# Patient Record
Sex: Female | Born: 2000 | Race: White | Hispanic: No | Marital: Single | Smoking: Never smoker
Health system: Southern US, Community
[De-identification: ages and names within clinical notes are randomized; demographics above are authoritative.]

## PROBLEM LIST (undated history)

## (undated) DIAGNOSIS — F909 Attention-deficit hyperactivity disorder, unspecified type: Secondary | ICD-10-CM

## (undated) DIAGNOSIS — S83106A Unspecified dislocation of unspecified knee, initial encounter: Secondary | ICD-10-CM

## (undated) HISTORY — DX: Attention-deficit hyperactivity disorder, unspecified type: F90.9

## (undated) HISTORY — PX: MOUTH RANULA EXCISION: SHX2049

## (undated) HISTORY — PX: TYMPANOSTOMY TUBE PLACEMENT: SHX32

---
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
## 2022-01-11 DIAGNOSIS — Z3046 Encounter for surveillance of implantable subdermal contraceptive: Secondary | ICD-10-CM | POA: Diagnosis not present

## 2022-01-11 DIAGNOSIS — Z3202 Encounter for pregnancy test, result negative: Secondary | ICD-10-CM | POA: Diagnosis not present

## 2022-01-12 ENCOUNTER — Institutional Professional Consult (permissible substitution): Payer: BC Managed Care – PPO | Admitting: Plastic Surgery

## 2022-01-13 DIAGNOSIS — F4325 Adjustment disorder with mixed disturbance of emotions and conduct: Secondary | ICD-10-CM | POA: Diagnosis not present

## 2022-02-01 ENCOUNTER — Telehealth: Payer: Self-pay | Admitting: Family Medicine

## 2022-02-01 DIAGNOSIS — F988 Other specified behavioral and emotional disorders with onset usually occurring in childhood and adolescence: Secondary | ICD-10-CM

## 2022-02-01 MED ORDER — VYVANSE 20 MG PO CAPS: 20.0000 mg | ORAL_CAPSULE | Freq: Every day | ORAL | 0 refills | Status: DC

## 2022-02-01 NOTE — Telephone Encounter (Signed)
Refill request for  Vyvance 20 mg LR 12/21/21, #30, 0 r LOV 10/13/21 FOV 02/12/22  Please review and advise.  Thanks.  Dm/cma

## 2022-02-01 NOTE — Telephone Encounter (Signed)
Pt needs her Vyvanse refilled at [facility] on [address].

## 2022-02-09 ENCOUNTER — Institutional Professional Consult (permissible substitution): Payer: BC Managed Care – PPO | Admitting: Plastic Surgery

## 2022-02-12 ENCOUNTER — Ambulatory Visit: Payer: BC Managed Care – PPO | Admitting: Family Medicine

## 2022-02-12 DIAGNOSIS — Z0142 Encounter for cervical smear to confirm findings of recent normal smear following initial abnormal smear: Secondary | ICD-10-CM | POA: Diagnosis not present

## 2022-02-12 DIAGNOSIS — Z681 Body mass index (BMI) 19 or less, adult: Secondary | ICD-10-CM | POA: Diagnosis not present

## 2022-02-12 DIAGNOSIS — Z113 Encounter for screening for infections with a predominantly sexual mode of transmission: Secondary | ICD-10-CM | POA: Diagnosis not present

## 2022-02-12 DIAGNOSIS — Z01419 Encounter for gynecological examination (general) (routine) without abnormal findings: Secondary | ICD-10-CM | POA: Diagnosis not present

## 2022-02-24 DIAGNOSIS — F411 Generalized anxiety disorder: Secondary | ICD-10-CM | POA: Diagnosis not present

## 2022-03-01 ENCOUNTER — Ambulatory Visit (INDEPENDENT_AMBULATORY_CARE_PROVIDER_SITE_OTHER): Payer: BC Managed Care – PPO | Admitting: Family Medicine

## 2022-03-01 ENCOUNTER — Encounter: Payer: Self-pay | Admitting: Family Medicine

## 2022-03-01 VITALS — BP 110/66 | HR 84 | Temp 97.9°F | Ht 65.0 in | Wt 100.4 lb

## 2022-03-01 DIAGNOSIS — R636 Underweight: Secondary | ICD-10-CM | POA: Insufficient documentation

## 2022-03-01 DIAGNOSIS — F988 Other specified behavioral and emotional disorders with onset usually occurring in childhood and adolescence: Secondary | ICD-10-CM | POA: Diagnosis not present

## 2022-03-01 MED ORDER — VYVANSE 20 MG PO CAPS: 20.0000 mg | ORAL_CAPSULE | Freq: Every day | ORAL | 0 refills | Status: DC

## 2022-03-01 NOTE — Progress Notes (Signed)
  [facility] [address] [place] [address] Dept: [phone] Dept Fax: [phone]  Chronic Care Office Visit  Subjective:    Patient ID: Stacey Cunningham, female    DOB: 2001-04-12, 21 y.o..   MRN: [id number]  Chief Complaint  Patient presents with   Follow-up    F/u ADHD/meds.  C/o weight.     History of Present Illness:  Patient is in today for reassessment of chronic medical issues.  Stacey Cunningham was diagnosed with ADHD in the 6th grade. She is currently managed on Vyvanse 20 mg daily. She did well this past school year and with her summer mathematics class, making As. She will be startign her sophomore year at [employer] this Fall.   Stacey Cunningham has always had a slim stature. She is pleased to see her weight has increased a small amount.  Stacey Cunningham had placement of an Nexplanon in June. So far she is tolerating this well.  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   Chronic depression 07/31/2020   Past Surgical History:  Procedure Laterality Date   MOUTH RANULA EXCISION     TYMPANOSTOMY TUBE PLACEMENT     WISDOM TOOTH EXTRACTION  01/2020   Family History  Problem Relation Age of Onset   Hyperlipidemia Father    Hypertension Father    Multiple sclerosis Father    Heart disease Maternal Grandmother    Heart attack Maternal Grandmother    Heart disease Maternal Grandfather    Cirrhosis Paternal Grandmother    Outpatient Medications Prior to Visit  Medication Sig Dispense Refill   etonogestrel (NEXPLANON) 68 MG IMPL implant Inject 1 implant by subcutaneous route.     VYVANSE 20 MG capsule Take 1 capsule (20 mg total) by mouth at bedtime. 30 capsule 0   No facility-administered medications prior to visit.   No Known Allergies    Objective:   Today's Vitals    03/01/22 1011  BP: 110/66  Pulse: 84  Temp: 97.9 F (36.6 C)  TempSrc: Temporal  SpO2: 98%  Weight: 100 lb 6.4 oz (45.5 kg)  Height: 5\' 5"  (1.651 m)   Body mass index is 16.71 kg/m.   General: Well developed, well nourished. No acute distress. Psych: Alert and oriented. Normal mood and affect.  Health Maintenance Due  Topic Date Due   CHLAMYDIA SCREENING  Never done   HIV Screening  Never done   Hepatitis C Screening  Never done     Assessment & Plan:   1. Attention deficit disorder (ADD) without hyperactivity Doing well on current dose of Vyvanse. I will continue this and plan to see her back over the winter break.  - VYVANSE 20 MG capsule; Take 1 capsule (20 mg total) by mouth at bedtime.  Dispense: 30 capsule; Refill: 0  2. Underweight Weight is up a few pounds since I last saw her. Lab assessment was normal. No sign of an eating disorder. We will follow.   Return in about 5 months (around 07/16/2022).   07/18/2022, MD

## 2022-03-10 DIAGNOSIS — F411 Generalized anxiety disorder: Secondary | ICD-10-CM | POA: Diagnosis not present

## 2022-03-24 DIAGNOSIS — F4325 Adjustment disorder with mixed disturbance of emotions and conduct: Secondary | ICD-10-CM | POA: Diagnosis not present

## 2022-04-07 DIAGNOSIS — F411 Generalized anxiety disorder: Secondary | ICD-10-CM | POA: Diagnosis not present

## 2022-04-21 DIAGNOSIS — F411 Generalized anxiety disorder: Secondary | ICD-10-CM | POA: Diagnosis not present

## 2022-05-03 ENCOUNTER — Other Ambulatory Visit: Payer: Self-pay | Admitting: Family Medicine

## 2022-05-03 DIAGNOSIS — F988 Other specified behavioral and emotional disorders with onset usually occurring in childhood and adolescence: Secondary | ICD-10-CM

## 2022-05-03 MED ORDER — VYVANSE 20 MG PO CAPS: 20.0000 mg | ORAL_CAPSULE | Freq: Every day | ORAL | 0 refills | Status: DC

## 2022-05-03 NOTE — Telephone Encounter (Signed)
Pt called back. [facility] not [facility] a pharmacy. Please sent to [facility] in [place], [place].  [place], [place]

## 2022-05-03 NOTE — Telephone Encounter (Signed)
Caller Name: pt Call back phone #: [phone]   MEDICATION(S):  VYVANSE 20 MG capsule [[id number]]     Has the patient contacted their pharmacy (YES/NO)? Yes contact your PCP.    Preferred Pharmacy:  [facility] Address: [address] [place], [place] [address] Phone: [phone]

## 2022-05-03 NOTE — Telephone Encounter (Signed)
Refill request for  Vyvance 20 mg LR 03/01/22, #30, 0 rf LOV 03/01/22 FOV 07/27/22  Please review and advise.  Thanks.  Dm/cma

## 2022-05-03 NOTE — Addendum Note (Signed)
Addended by: Konrad Saha on: 05/03/2022 04:39 PM   Modules accepted: Orders

## 2022-05-05 DIAGNOSIS — F4325 Adjustment disorder with mixed disturbance of emotions and conduct: Secondary | ICD-10-CM | POA: Diagnosis not present

## 2022-05-19 DIAGNOSIS — F411 Generalized anxiety disorder: Secondary | ICD-10-CM | POA: Diagnosis not present

## 2022-06-02 DIAGNOSIS — F4325 Adjustment disorder with mixed disturbance of emotions and conduct: Secondary | ICD-10-CM | POA: Diagnosis not present

## 2022-06-07 ENCOUNTER — Other Ambulatory Visit: Payer: Self-pay | Admitting: Family Medicine

## 2022-06-07 DIAGNOSIS — F988 Other specified behavioral and emotional disorders with onset usually occurring in childhood and adolescence: Secondary | ICD-10-CM

## 2022-06-07 MED ORDER — VYVANSE 20 MG PO CAPS: 20.0000 mg | ORAL_CAPSULE | Freq: Every day | ORAL | 0 refills | Status: DC

## 2022-06-07 NOTE — Telephone Encounter (Signed)
Caller Name: kurstyn Call back phone #: [phone]   MEDICATION(S):  VYVANSE 20 MG capsule [[id number]]   Days of Med Remaining:   Has the patient contacted their pharmacy (YES/NO)? yes What did pharmacy advise? Pcp has to put order in  Preferred Pharmacy:  [place], [place] - [place] Phone: [phone]      ~~~Please advise patient/caregiver to allow 2-3 business days to process RX refills.

## 2022-06-07 NOTE — Telephone Encounter (Signed)
Refill request for  Vyvance 20 mg  LR  05/03/22, #30, 0 rf LOV  03/01/22 FOV  07/27/22  Please review and advise  Thanks. Dm/cma

## 2022-06-08 ENCOUNTER — Telehealth: Payer: Self-pay | Admitting: Family Medicine

## 2022-06-08 DIAGNOSIS — F988 Other specified behavioral and emotional disorders with onset usually occurring in childhood and adolescence: Secondary | ICD-10-CM

## 2022-06-08 MED ORDER — VYVANSE 20 MG PO CAPS: 20.0000 mg | ORAL_CAPSULE | Freq: Every day | ORAL | 0 refills | Status: DC

## 2022-06-08 NOTE — Telephone Encounter (Signed)
Pt want to know if her Vyvanse  can be transferred from [facility] to  [place], [place] - [place] Phone: [phone]  Fax: [phone]

## 2022-06-08 NOTE — Telephone Encounter (Signed)
Called and canceled the RX that went to [facility]. Dm/cma

## 2022-06-08 NOTE — Telephone Encounter (Signed)
Lft VM that RX will be sent to [facility] in [place].  Dm/cma

## 2022-06-16 DIAGNOSIS — F4325 Adjustment disorder with mixed disturbance of emotions and conduct: Secondary | ICD-10-CM | POA: Diagnosis not present

## 2022-06-30 DIAGNOSIS — F411 Generalized anxiety disorder: Secondary | ICD-10-CM | POA: Diagnosis not present

## 2022-07-02 HISTORY — PX: MOLE REMOVAL: SHX2046

## 2022-07-15 DIAGNOSIS — D2272 Melanocytic nevi of left lower limb, including hip: Secondary | ICD-10-CM | POA: Diagnosis not present

## 2022-07-15 DIAGNOSIS — L905 Scar conditions and fibrosis of skin: Secondary | ICD-10-CM | POA: Diagnosis not present

## 2022-07-15 DIAGNOSIS — D485 Neoplasm of uncertain behavior of skin: Secondary | ICD-10-CM | POA: Diagnosis not present

## 2022-07-22 DIAGNOSIS — N898 Other specified noninflammatory disorders of vagina: Secondary | ICD-10-CM | POA: Diagnosis not present

## 2022-07-22 DIAGNOSIS — N390 Urinary tract infection, site not specified: Secondary | ICD-10-CM | POA: Diagnosis not present

## 2022-07-22 DIAGNOSIS — Z118 Encounter for screening for other infectious and parasitic diseases: Secondary | ICD-10-CM | POA: Diagnosis not present

## 2022-07-22 DIAGNOSIS — R300 Dysuria: Secondary | ICD-10-CM | POA: Diagnosis not present

## 2022-07-22 DIAGNOSIS — R102 Pelvic and perineal pain: Secondary | ICD-10-CM | POA: Diagnosis not present

## 2022-07-27 ENCOUNTER — Encounter: Payer: Self-pay | Admitting: Family Medicine

## 2022-07-27 ENCOUNTER — Ambulatory Visit (INDEPENDENT_AMBULATORY_CARE_PROVIDER_SITE_OTHER): Payer: BC Managed Care – PPO | Admitting: Family Medicine

## 2022-07-27 VITALS — BP 106/66 | HR 86 | Temp 97.4°F | Ht 65.0 in | Wt 98.0 lb

## 2022-07-27 DIAGNOSIS — R636 Underweight: Secondary | ICD-10-CM | POA: Diagnosis not present

## 2022-07-27 DIAGNOSIS — F988 Other specified behavioral and emotional disorders with onset usually occurring in childhood and adolescence: Secondary | ICD-10-CM

## 2022-07-27 MED ORDER — VYVANSE 20 MG PO CAPS: 20.0000 mg | ORAL_CAPSULE | Freq: Every day | ORAL | 0 refills | Status: DC

## 2022-07-27 NOTE — Progress Notes (Signed)
  [facility] [address] [place] [address] Dept: [phone] Dept Fax: [phone]  Chronic Care Office Visit  Subjective:    Patient ID: Stacey Cunningham, female    DOB: [birth year], 21 y.o..   MRN: [id number]  Chief Complaint  Patient presents with   Follow-up    5 month f/u ADHD. No concerns.      History of Present Illness:  Patient is in today for reassessment of chronic medical issues.  Ms. Tietje was diagnosed with ADHD in the 6th grade. She is currently managed on Vyvanse 20 mg daily. She did well this past semester, making As and Bs. She is a sophomore at [employer].   Ms. Greif has always had a slim stature. Her weight has remained stable and she is not having significant weight loss on Vyvanse.  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   Chronic depression 07/31/2020   Past Surgical History:  Procedure Laterality Date   MOLE REMOVAL  07/2022   LT bid toe   MOUTH RANULA EXCISION     TYMPANOSTOMY TUBE PLACEMENT     WISDOM TOOTH EXTRACTION  01/2020   Family History  Problem Relation Age of Onset   Hyperlipidemia Father    Hypertension Father    Multiple sclerosis Father    Heart disease Maternal Grandmother    Heart attack Maternal Grandmother    Heart disease Maternal Grandfather    Cirrhosis Paternal Grandmother    Outpatient Medications Prior to Visit  Medication Sig Dispense Refill   etonogestrel (NEXPLANON) 68 MG IMPL implant Inject 1 implant by subcutaneous route.     VYVANSE 20 MG capsule Take 1 capsule (20 mg total) by mouth at bedtime. 30 capsule 0   No facility-administered medications prior to visit.   No Known Allergies    Objective:   Today's Vitals   07/27/22 1007  BP: 106/66  Pulse: 86  Temp: (!) 97.4 F (36.3 C)  TempSrc:  Temporal  SpO2: 93%  Weight: 98 lb (44.5 kg)  Height: 5\' 5"  (1.651 m)   Body mass index is 16.31 kg/m.   General: Well developed, well nourished. No acute distress. Psych: Alert and oriented. Normal mood and affect.  Health Maintenance Due  Topic Date Due   CHLAMYDIA SCREENING  Never done   HIV Screening  Never done   Hepatitis C Screening  Never done     Assessment & Plan:   1. Attention deficit disorder (ADD) without hyperactivity Doing well on current dose of Vyvanse. I will continue this and plan to see her back over the summer break.   - VYVANSE 20 MG capsule; Take 1 capsule (20 mg total) by mouth at bedtime.  Dispense: 30 capsule; Refill: 0  2. Underweight Weight is stable. We will continue to monitor this.   Return in about 6 months (around 01/26/2023) for Reassessment.   01/28/2023, MD

## 2022-10-01 ENCOUNTER — Other Ambulatory Visit: Payer: Self-pay | Admitting: Family Medicine

## 2022-10-01 DIAGNOSIS — F988 Other specified behavioral and emotional disorders with onset usually occurring in childhood and adolescence: Secondary | ICD-10-CM

## 2022-10-01 MED ORDER — VYVANSE 20 MG PO CAPS: 20.0000 mg | ORAL_CAPSULE | Freq: Every day | ORAL | 0 refills | Status: DC

## 2022-10-01 NOTE — Telephone Encounter (Signed)
Prescription Request  10/01/2022   LOV: 07/27/2022  What is the name of the medication or equipment? VYVANSE 20 MG capsule [id number]   Have you contacted your pharmacy to request a refill? Yes   Which pharmacy would you like this sent to?   [place], [place] - [place] [id number] [place] [id number] Phone: [phone] Fax: [phone]    Patient notified that their request is being sent to the clinical staff for review and that they should receive a response within 2 business days.   Please advise at Mobile [phone] (mobile)

## 2022-10-01 NOTE — Telephone Encounter (Signed)
Refill request for  Vyvance 20 mg LR 07/24/22, #30, 0 rf LOV 07/27/22 FOV  none scheduled.   Please review and advise. Dm/cma

## 2022-11-16 ENCOUNTER — Telehealth: Payer: Self-pay | Admitting: Family Medicine

## 2022-11-16 NOTE — Telephone Encounter (Signed)
Pt is wanting to change her VYVANSE 20 MG capsule [[id number]] to ADZENYS XR 6.3mg .   [facility] at [facility]: [address] [place], [place], [place] [address] Phone: [phone]  Please advise pt at [phone]

## 2022-11-16 NOTE — Telephone Encounter (Signed)
Lft VM to rtn call to schedule a VV to discuss medication changes.  Dm/cma

## 2022-11-17 NOTE — Telephone Encounter (Signed)
Patient scheduled for VV on 11/19/22 :20. Dm/cma

## 2022-11-19 ENCOUNTER — Encounter: Payer: Self-pay | Admitting: Family Medicine

## 2022-11-19 ENCOUNTER — Telehealth (INDEPENDENT_AMBULATORY_CARE_PROVIDER_SITE_OTHER): Payer: Self-pay | Admitting: Family Medicine

## 2022-11-19 VITALS — Ht 65.0 in | Wt 100.0 lb

## 2022-11-19 DIAGNOSIS — F988 Other specified behavioral and emotional disorders with onset usually occurring in childhood and adolescence: Secondary | ICD-10-CM

## 2022-11-19 MED ORDER — ADZENYS XR-ODT 12.5 MG PO TBED: 1.0000 | EXTENDED_RELEASE_TABLET | Freq: Every day | ORAL | 0 refills | Status: DC

## 2022-11-19 NOTE — Assessment & Plan Note (Signed)
We will give a trial of switching from Vyvanse (lisdexamfetamine) 20 mg daily to Adzenys XR (amphetamine). It appears that 12.5 mg would be an appropriate starting dose. She should contact me should she start to notice side effects, such as sleep disturbance or poor appetite. I will plan to see her for an in-person visit during her summer break.

## 2022-11-19 NOTE — Progress Notes (Signed)
[facility] [address] [place] [address] Dept: [phone] Dept Fax: [phone]  Virtual Video Visit  I connected with Stacey Cunningham on 11/19/22 at  9:20 AM EDT by a video enabled telemedicine application and verified that I am speaking with the correct person using two identifiers.  Location patient: Apartment in [place] [employer] at [employer]) Location provider: Clinic Persons participating in the virtual visit: Patient, Provider  I discussed the limitations of evaluation and management by telemedicine and the availability of in person appointments. The patient expressed understanding and agreed to proceed.  Chief Complaint  Patient presents with   Medical Management of Chronic Issues    Discuss changing medications     SUBJECTIVE:  HPI: Stacey Cunningham is a 22 y.o. female who presents to discuss her ADHD management. Ms. Gravelle was diagnosed with ADHD in the 6th grade. She is currently managed on Vyvanse 20 mg daily. She is a Medical laboratory scientific officer at [employer], Glass blower/designer in [employer], educational. Her studies have gone well. She notes her sister, who also has ADHD, was switched to Adzenys XR and that this has been more affordable for the family.  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   Chronic depression 07/31/2020   Past Surgical History:  Procedure Laterality Date   MOLE REMOVAL  07/2022   LT bid toe   MOUTH RANULA EXCISION     TYMPANOSTOMY TUBE PLACEMENT     WISDOM TOOTH EXTRACTION  01/2020   Family History  Problem Relation Age of Onset   Hyperlipidemia Father    Hypertension Father    Multiple sclerosis Father    Heart disease Maternal Grandmother    Heart attack Maternal Grandmother    Heart disease Maternal Grandfather    Cirrhosis Paternal Grandmother    Social History   Tobacco Use   Smoking status:  Never   Smokeless tobacco: Never  Vaping Use   Vaping Use: Never used  Substance Use Topics   Alcohol use: Yes    Comment: Rare   Drug use: Never    Current Outpatient Medications:    Amphetamine ER (ADZENYS XR-ODT) 12.5 MG TBED, Take 1 tablet by mouth daily., Disp: 30 tablet, Rfl: 0   etonogestrel (NEXPLANON) 68 MG IMPL implant, Inject 1 implant by subcutaneous route., Disp: , Rfl:    VYVANSE 20 MG capsule, Take 1 capsule (20 mg total) by mouth at bedtime., Disp: 30 capsule, Rfl: 0 No Known Allergies ROS: See pertinent positives and negatives per HPI.  OBSERVATIONS/OBJECTIVE:  VITALS per patient if applicable: Today's Vitals   11/19/22 0904  Weight: 100 lb (45.4 kg)  Height:  (1.651 m)   Body mass index is 16.64 kg/m.   GENERAL: Alert and oriented. Appears well and in no acute distress. PSYCH/NEURO: Pleasant and cooperative. No obvious depression or anxiety. Speech and thought processing grossly intact.  ASSESSMENT AND PLAN:  Problem List Items Addressed This Visit       Other   Attention deficit disorder (ADD) without hyperactivity - Primary    We will give a trial of switching from Vyvanse (lisdexamfetamine) 20 mg daily to Adzenys XR (amphetamine). It appears that 12.5 mg would be an appropriate starting dose. She should contact me should she start to notice side effects, such as sleep disturbance or poor appetite. I will plan to see her for an in-person visit  during her summer break.      Relevant Medications   Amphetamine ER (ADZENYS XR-ODT) 12.5 MG TBED     I discussed the assessment and treatment plan with the patient. The patient was provided an opportunity to ask questions and all were answered. The patient agreed with the plan and demonstrated an understanding of the instructions.   The patient was advised to call back or seek an in-person evaluation if the symptoms worsen or if the condition fails to improve as anticipated.  Return in about 3 months  (around 02/18/2023) for Reassessment.   Loyola Mast, MD

## 2023-04-18 ENCOUNTER — Other Ambulatory Visit: Payer: Self-pay | Admitting: Family Medicine

## 2023-04-18 DIAGNOSIS — F988 Other specified behavioral and emotional disorders with onset usually occurring in childhood and adolescence: Secondary | ICD-10-CM

## 2023-04-18 NOTE — Telephone Encounter (Signed)
Refill needed  Amphetamine ER (ADZENYS XR-ODT) 12.5 MG TBED [[id number]]   [name]. [place], [place] - [address] [place] [place] [address] Phone: [phone]  Fax: [phone]

## 2023-04-19 MED ORDER — ADZENYS XR-ODT 12.5 MG PO TBED: 1.0000 | EXTENDED_RELEASE_TABLET | Freq: Every day | ORAL | 0 refills | Status: DC

## 2023-04-19 NOTE — Telephone Encounter (Signed)
Refill request for    Amphetamine ER (ADZENYS XR-ODT) 12.5 MG TBED  LR 11/19/22, #30, 0 rf LOV  11/19/22 FOV  none scheduled  Please review and advise.  Thanks. Dm/cma

## 2023-04-20 ENCOUNTER — Telehealth: Payer: Self-pay | Admitting: Family Medicine

## 2023-04-20 DIAGNOSIS — F988 Other specified behavioral and emotional disorders with onset usually occurring in childhood and adolescence: Secondary | ICD-10-CM

## 2023-04-20 MED ORDER — ADZENYS XR-ODT 12.5 MG PO TBED: 1.0000 | EXTENDED_RELEASE_TABLET | Freq: Every day | ORAL | 0 refills | Status: DC

## 2023-04-20 NOTE — Telephone Encounter (Addendum)
[name] [phone] called and said the prescription that was sent there was for quality of 14. [name] stated that the medication only come in a box of 30 and they cannot break the box. [name] said can you write a new prescription of 30.. please give her a call. The name of medication is [employer]

## 2023-04-20 NOTE — Telephone Encounter (Signed)
Patient scheduled for 05/06/23.  Dm/cma

## 2023-05-06 ENCOUNTER — Ambulatory Visit (INDEPENDENT_AMBULATORY_CARE_PROVIDER_SITE_OTHER): Payer: No Typology Code available for payment source | Admitting: Family Medicine

## 2023-05-06 ENCOUNTER — Encounter: Payer: Self-pay | Admitting: Family Medicine

## 2023-05-06 VITALS — BP 100/66 | HR 97 | Temp 98.6°F | Ht 65.0 in | Wt 106.0 lb

## 2023-05-06 DIAGNOSIS — F988 Other specified behavioral and emotional disorders with onset usually occurring in childhood and adolescence: Secondary | ICD-10-CM

## 2023-05-06 DIAGNOSIS — Z23 Encounter for immunization: Secondary | ICD-10-CM | POA: Diagnosis not present

## 2023-05-06 MED ORDER — ADZENYS XR-ODT 12.5 MG PO TBED: 1.0000 | EXTENDED_RELEASE_TABLET | Freq: Every day | ORAL | 0 refills | Status: DC

## 2023-05-06 NOTE — Progress Notes (Signed)
  [facility] [address] [place] [address] Dept: [phone] Dept Fax: [phone]  Chronic Care Office Visit  Subjective:    Patient ID: Stacey Cunningham, female    DOB: May 30, 2001, 22 y.o..   MRN: [id number]  Chief Complaint  Patient presents with   Follow-up   Medication Refill   History of Present Illness:  Patient is in today for reassessment of chronic medical issues.  Ms. Kopf was diagnosed with ADHD in the 6th grade.  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 School has been going well, though there has been interruption due to [employer] on [place]. When I last met with her in April, we switched her from Vyvanse to Adzenys XR-ODT 12.5 mg daily. She feels this is being more effective for her.  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   Chronic depression 07/31/2020   Past Surgical History:  Procedure Laterality Date   MOLE REMOVAL  07/2022   LT bid toe   MOUTH RANULA EXCISION     TYMPANOSTOMY TUBE PLACEMENT     WISDOM TOOTH EXTRACTION  01/2020   Family History  Problem Relation Age of Onset   Hyperlipidemia Father    Hypertension Father    Multiple sclerosis Father    Heart disease Maternal Grandmother    Heart attack Maternal Grandmother    Heart disease Maternal Grandfather    Cirrhosis Paternal Grandmother    Outpatient Medications Prior to Visit  Medication Sig Dispense Refill   etonogestrel (NEXPLANON) 68 MG IMPL implant Inject 1 implant by subcutaneous route.     Amphetamine ER (ADZENYS XR-ODT) 12.5 MG TBED Take 1 tablet by mouth daily. 30 tablet 0   VYVANSE 20 MG capsule Take 1 capsule (20 mg total) by mouth at bedtime. (Patient not taking: Reported on 05/06/2023) 30 capsule 0   No facility-administered medications prior to  visit.   No Known Allergies Objective:   Today's Vitals   05/06/23 1329  BP: 100/66  Pulse: 97  Temp: 98.6 F (37 C)  TempSrc: Temporal  SpO2: 97%  Weight: 106 lb (48.1 kg)  Height: 5\' 5"  (1.651 m)   Body mass index is 17.64 kg/m.   General: Well developed, well nourished. No acute distress. Psych: Alert and oriented. Normal mood and affect.  Health Maintenance Due  Topic Date Due   CHLAMYDIA SCREENING  Never done   HIV Screening  Never done   Hepatitis C Screening  Never done   INFLUENZA VACCINE  03/03/2023     Assessment & Plan:   Problem List Items Addressed This Visit       Other   Attention deficit disorder (ADD) without hyperactivity    Stable. would be an appropriate starting dose. Mild tachycardia today, but weight is up form previous. Continue Adzenys XR-ODT (amphetamine) 12.5 mg daily. Plan follow-up over Spring Break.      Relevant Medications   Amphetamine ER (ADZENYS XR-ODT) 12.5 MG TBED    Return in about 6 months (around 11/04/2023) for Reassessment.   Loyola Mast, MD

## 2023-05-06 NOTE — Assessment & Plan Note (Signed)
Stable. would be an appropriate starting dose. Mild tachycardia today, but weight is up form previous. Continue Adzenys XR-ODT (amphetamine) 12.5 mg daily. Plan follow-up over Spring Break.

## 2023-05-06 NOTE — Addendum Note (Signed)
Addended by: Mary Sella D on: 05/06/2023 02:47 PM   Modules accepted: Orders

## 2023-10-03 ENCOUNTER — Other Ambulatory Visit: Payer: Self-pay | Admitting: Family Medicine

## 2023-10-03 DIAGNOSIS — F988 Other specified behavioral and emotional disorders with onset usually occurring in childhood and adolescence: Secondary | ICD-10-CM

## 2023-10-03 NOTE — Telephone Encounter (Signed)
 Copied from CRM [id number]. Topic: Clinical - Medication Refill >> Oct 03, 2023  4:18 PM Corin V wrote: Most Recent Primary Care Visit:  Provider: Loyola Mast  Department: [facility]  Visit Type: OFFICE VISIT  Date: 05/06/2023  Medication: Amphetamine ER (ADZENYS XR-ODT) 12.5 MG TBED  Has the patient contacted their pharmacy? Yes- normal pharmacy unable to fill script (Agent: If no, request that the patient contact the pharmacy for the refill. If patient does not wish to contact the pharmacy document the reason why and proceed with request.) (Agent: If yes, when and what did the pharmacy advise?)  Is this the correct pharmacy for this prescription? Yes If no, delete pharmacy and type the correct one.  This is the patient's preferred pharmacy:   [name] [place] [phone]  Has the prescription been filled recently? No  Is the patient out of the medication? No  Has the patient been seen for an appointment in the last year OR does the patient have an upcoming appointment? Yes  Can we respond through MyChart? No  Agent: Please be advised that Rx refills may take up to 3 business days. We ask that you follow-up with your pharmacy.

## 2023-10-04 ENCOUNTER — Other Ambulatory Visit: Payer: Self-pay

## 2023-10-04 DIAGNOSIS — F988 Other specified behavioral and emotional disorders with onset usually occurring in childhood and adolescence: Secondary | ICD-10-CM

## 2023-10-04 MED ORDER — ADZENYS XR-ODT 12.5 MG PO TBED
1.0000 | EXTENDED_RELEASE_TABLET | Freq: Every day | ORAL | 0 refills | Status: DC
Start: 2023-10-04 — End: 2023-11-15

## 2023-10-04 MED ORDER — ADZENYS XR-ODT 12.5 MG PO TBED: 1.0000 | EXTENDED_RELEASE_TABLET | Freq: Every day | ORAL | 0 refills | Status: DC

## 2023-10-04 NOTE — Telephone Encounter (Signed)
 Copied from CRM [id number]. Topic: Clinical - Prescription Issue >> Oct 04, 2023  4:14 PM Fredrich Romans wrote: Reason for CRM: Patient needs medication Amphetamine ER (ADZENYS XR-ODT) 12.5 MG TBED sent to  [facility], [place] - [address]  Phone: [phone] Fax: [phone]  Prescription was sent to wrong pharmacy,Patient is in [place] ,[place]  Forwarding message below. Dr. Veto Kemps can you sign RX order it is currently pending to correct pharmacy location.

## 2023-10-04 NOTE — Telephone Encounter (Signed)
 Requesting: Amphetamine ER 12.5mg  Last Visit: 05/06/2023 Next Visit: Visit date not found Last Refill: 05/06/2023  Please Advise

## 2023-10-06 ENCOUNTER — Telehealth: Payer: Self-pay

## 2023-10-06 NOTE — Telephone Encounter (Signed)
 Pharmacy Patient Advocate Encounter  Received notification from [facility] that Prior Authorization for Adzenys XR-ODT 12.5MG  er dispersible tablets has been DENIED.  See denial reason below. No denial letter attached in CMM. Will attach denial letter to Media tab once received.   PA #/Case ID/Reference #: [id number]

## 2023-10-06 NOTE — Telephone Encounter (Signed)
 Pharmacy Patient Advocate Encounter   Received notification from  [facility] Portal that prior authorization for Adzenys XR-ODT 12.5MG  er dispersible tablets is required/requested.   Insurance verification completed.   The patient is insured through [facility] .   Per test claim: PA required; PA submitted to above mentioned insurance via CoverMyMeds Key/confirmation #/EOC [id number] Status is pending

## 2023-10-07 NOTE — Telephone Encounter (Signed)
 Please review the denial and other options for medications.          Creation Time: 10/06/2023  3:58 PM   Signed

## 2023-10-10 NOTE — Telephone Encounter (Signed)
 PA re-submitted with information of why she can't take the alternative medications through cover my meds.   Awaiting response.  Dm/cma   KeyBurnett Harry

## 2023-10-11 NOTE — Telephone Encounter (Signed)
 PA denied again.  Submitted OV notes and documentation for Appeals to  [employer]  Resolution Team @  [phone].  Awaiting response. Dm/cma

## 2023-11-15 ENCOUNTER — Other Ambulatory Visit: Payer: Self-pay | Admitting: Family Medicine

## 2023-11-15 DIAGNOSIS — F988 Other specified behavioral and emotional disorders with onset usually occurring in childhood and adolescence: Secondary | ICD-10-CM

## 2023-11-17 ENCOUNTER — Other Ambulatory Visit (HOSPITAL_COMMUNITY): Payer: Self-pay

## 2023-12-27 ENCOUNTER — Other Ambulatory Visit: Payer: Self-pay | Admitting: Family Medicine

## 2023-12-27 DIAGNOSIS — F988 Other specified behavioral and emotional disorders with onset usually occurring in childhood and adolescence: Secondary | ICD-10-CM

## 2023-12-27 NOTE — Telephone Encounter (Signed)
 Refill request for  Amephetamine ER LR  11/15/23 LOV  05/06/23 FOV   none scheduled.   Please review and advise.  Thanks.  Dm/cma

## 2023-12-29 ENCOUNTER — Other Ambulatory Visit (HOSPITAL_COMMUNITY): Payer: Self-pay

## 2024-02-02 ENCOUNTER — Other Ambulatory Visit: Payer: Self-pay | Admitting: Family Medicine

## 2024-02-02 DIAGNOSIS — F988 Other specified behavioral and emotional disorders with onset usually occurring in childhood and adolescence: Secondary | ICD-10-CM

## 2024-02-06 NOTE — Telephone Encounter (Signed)
 Called pharmacy, patient will only get a 14 day supply due to being over due for an appointment.  They will not break a box open.   Tried to call patient and left VM for her to call us  back to see if another pharmacy will do it and schedule her an appointment.

## 2024-02-06 NOTE — Telephone Encounter (Unsigned)
 Copied from CRM [id number]. Topic: Clinical - Prescription Issue >> Feb 06, 2024  2:15 PM Martinique E wrote: Reason for CRM: Damien from [facility] called in regards to the patient's ADZENYS  XR-ODT 12.5 MG TBED. Damien stated they only dispense this medication as a quantity of 30 tablets, when the order states 14 tablets. Questioning if PCP can send over a new prescription with a dispense quantity of 30 instead. Callback number [phone].

## 2024-02-06 NOTE — Telephone Encounter (Signed)
 Refill request for Adzenys  XR-ODT 12.5 mg LR  12/27/23, #30, 0 rf LOV 05/06/23 FOV none scheduled.   Please review and advise.  Dm/cma

## 2024-02-08 ENCOUNTER — Ambulatory Visit: Admitting: Family Medicine

## 2024-02-08 ENCOUNTER — Encounter: Payer: Self-pay | Admitting: Family Medicine

## 2024-02-08 DIAGNOSIS — F988 Other specified behavioral and emotional disorders with onset usually occurring in childhood and adolescence: Secondary | ICD-10-CM | POA: Diagnosis not present

## 2024-02-08 DIAGNOSIS — D225 Melanocytic nevi of trunk: Secondary | ICD-10-CM | POA: Insufficient documentation

## 2024-02-08 MED ORDER — ADZENYS XR-ODT 12.5 MG PO TBED
12.5000 mg | EXTENDED_RELEASE_TABLET | Freq: Every day | ORAL | 0 refills | Status: DC
Start: 2024-02-08 — End: 2024-03-14

## 2024-02-08 NOTE — Progress Notes (Signed)
  [facility] [address] [place] [address] Dept: [phone] Dept Fax: [phone]  Chronic Care Office Visit  Subjective:    Patient ID: Stacey Cunningham, female    DOB: [birth year], 23 y.o..   MRN: [id number]  Chief Complaint  Patient presents with   ADHD    F/u ADHD.     History of Present Illness:  Patient is in today for reassessment of chronic medical issues.  [name] was diagnosed with ADHD in the 6th grade.  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 School has been going well. She notes she had all A's this past year. She is taking summer classes and workign at [employer] in [place]. Plans to move to graduate school after graduation, with [place]-term goal to provide art therapy. Currently ADHD is managed on Adzenys  XR-ODT 12.5 mg daily. Focus has been good.  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   Chronic depression 07/31/2020   Past Surgical History:  Procedure Laterality Date   MOLE REMOVAL  07/2022   LT bid toe   MOUTH RANULA EXCISION     TYMPANOSTOMY TUBE PLACEMENT     WISDOM TOOTH EXTRACTION  01/2020   Family History  Problem Relation Age of Onset   Hyperlipidemia Father    Hypertension Father    Multiple sclerosis Father    Heart disease Maternal Grandmother    Heart attack Maternal Grandmother    Heart disease Maternal Grandfather    Cirrhosis Paternal Grandmother    Outpatient Medications Prior to Visit  Medication Sig Dispense Refill   etonogestrel  (NEXPLANON ) 68 MG IMPL implant Inject 1 implant by subcutaneous route.     ADZENYS  XR-ODT 12.5 MG TBED TAKE 1 TABLET BY MOUTH DAILY. MAX COUPON USAGE: 1 RX 14 tablet 0   No facility-administered medications prior to visit.   No Known  Allergies Objective:   Today's Vitals   02/08/24 1048  BP: 110/66  Pulse: 71  Temp: (!) 97.3 F (36.3 C)  TempSrc: Temporal  SpO2: 98%  Weight: 109 lb (49.4 kg)  Height: 5' 5 (1.651 m)   Body mass index is 18.14 kg/m.   General: Well developed, well nourished. No acute distress. Psych: Alert and oriented. Normal mood and affect.  Health Maintenance Due  Topic Date Due   CHLAMYDIA SCREENING  Never done   HIV Screening  Never done   Hepatitis C Screening  Never done   Meningococcal B Vaccine (2 of 2 - Bexsero SCDM 2-dose series) 04/16/2022   Cervical Cancer Screening (Pap smear)  Never done   Assessment & Plan:   Problem List Items Addressed This Visit       Other   Attention deficit disorder (ADD) without hyperactivity   Stable. Continue Adzenys  XR-ODT (amphetamine ) 12.5 mg daily. Plan follow-up over Winter Break.      Relevant Medications   ADZENYS  XR-ODT 12.5 MG TBED    Return in about 6 months (around 08/10/2024) for Reassessment.   Garnette CHRISTELLA Simpler, MD

## 2024-02-08 NOTE — Assessment & Plan Note (Signed)
 Stable. Continue Adzenys  XR-ODT (amphetamine ) 12.5 mg daily. Plan follow-up over Winter Break.

## 2024-02-22 ENCOUNTER — Ambulatory Visit: Admitting: Family Medicine

## 2024-03-14 ENCOUNTER — Other Ambulatory Visit: Payer: Self-pay | Admitting: Family Medicine

## 2024-03-14 DIAGNOSIS — F988 Other specified behavioral and emotional disorders with onset usually occurring in childhood and adolescence: Secondary | ICD-10-CM

## 2024-03-14 MED ORDER — ADZENYS XR-ODT 12.5 MG PO TBED: 12.5000 mg | EXTENDED_RELEASE_TABLET | Freq: Every day | ORAL | 0 refills | Status: DC

## 2024-03-14 NOTE — Telephone Encounter (Signed)
 Copied from CRM #[id number]. Topic: Clinical - Medication Refill >> Mar 14, 2024  1:55 PM Mesmerise C wrote: Medication:  ADZENYS  XR-ODT 12.5 MG TBED    Has the patient contacted their pharmacy? No (Agent: If no, request that the patient contact the pharmacy for the refill. If patient does not wish to contact the pharmacy document the reason why and proceed with request.) (Agent: If yes, when and what did the pharmacy advise?)  This is the patient's preferred pharmacy:   [facility], [place] - [address] [place] [address] Phone: [phone] Fax: [phone]  Is this the correct pharmacy for this prescription? Yes If no, delete pharmacy and type the correct one.   Has the prescription been filled recently? No  Is the patient out of the medication? No  Has the patient been seen for an appointment in the last year OR does the patient have an upcoming appointment? Yes  Can we respond through MyChart? Yes  Agent: Please be advised that Rx refills may take up to 3 business days. We ask that you follow-up with your pharmacy.

## 2024-03-16 ENCOUNTER — Other Ambulatory Visit (HOSPITAL_COMMUNITY): Payer: Self-pay

## 2024-03-16 ENCOUNTER — Telehealth: Payer: Self-pay

## 2024-03-16 NOTE — Telephone Encounter (Signed)
 Pharmacy Patient Advocate Encounter   Received notification from Onbase that prior authorization for Adzenys XR-ODT 12.5 mg is required/requested.   Insurance verification completed.   The patient is insured through [facility] .   Per test claim: PA required; PA submitted to above mentioned insurance via Latent Key/confirmation #/EOC [id number] Status is pending

## 2024-03-19 ENCOUNTER — Other Ambulatory Visit (HOSPITAL_COMMUNITY): Payer: Self-pay

## 2024-03-19 NOTE — Telephone Encounter (Signed)
 Pharmacy Patient Advocate Encounter  Received notification from [facility] that Prior Authorization for Adzenys  XR-ODT 12.5 mg tabs has been APPROVED from 03/16/24 to 03/16/25. Ran test claim, Copay is $326.24 due to deductible. This test claim was processed through [facility]- copay amounts may vary at other pharmacies due to pharmacy/plan contracts, or as the patient moves through the different stages of their insurance plan.   PA #/Case ID/Reference #: [id number]

## 2024-03-19 NOTE — Telephone Encounter (Signed)
 Called pharmacy and patient picked up the RX today. Dm/cma

## 2024-04-19 ENCOUNTER — Other Ambulatory Visit: Payer: Self-pay | Admitting: Family Medicine

## 2024-04-19 DIAGNOSIS — F988 Other specified behavioral and emotional disorders with onset usually occurring in childhood and adolescence: Secondary | ICD-10-CM

## 2024-04-19 MED ORDER — ADZENYS XR-ODT 12.5 MG PO TBED: 12.5000 mg | EXTENDED_RELEASE_TABLET | Freq: Every day | ORAL | 0 refills | Status: DC

## 2024-04-19 NOTE — Telephone Encounter (Signed)
 Refill request for  Adzenzy XR  ODT 12.5 mg LR 03/14/24, #30, 0 f LOV  02/08/24 FOV  07/14/24  Please review and advise.  Thanks. Dm/cma

## 2024-04-19 NOTE — Telephone Encounter (Unsigned)
 Copied from CRM [id number]. Topic: Clinical - Medication Refill >> Apr 19, 2024  1:01 PM Armenia J wrote: Medication: ADZENYS  XR-ODT 12.5 MG TBED  Has the patient contacted their pharmacy? No (Agent: If no, request that the patient contact the pharmacy for the refill. If patient does not wish to contact the pharmacy document the reason why and proceed with request.) (Agent: If yes, when and what did the pharmacy advise?) Patient forgot and was already in the process of filling the refill with us .   This is the patient's preferred pharmacy:  [facility] [place], [place] - [address] [place] [address] Phone: [phone] Fax: [phone]  Is this the correct pharmacy for this prescription? Yes If no, delete pharmacy and type the correct one.   Has the prescription been filled recently? No  Is the patient out of the medication? Yes  Has the patient been seen for an appointment in the last year OR does the patient have an upcoming appointment? Yes  Can we respond through MyChart? Yes  Agent: Please be advised that Rx refills may take up to 3 business days. We ask that you follow-up with your pharmacy.

## 2024-05-22 ENCOUNTER — Telehealth: Payer: Self-pay | Admitting: Family Medicine

## 2024-05-22 NOTE — Telephone Encounter (Unsigned)
 Copied from CRM [id number]. Topic: Clinical - Medication Refill >> May 22, 2024  2:20 PM Alfonso HERO wrote: Medication: ADZENYS  XR-ODT 12.5 MG TBED  Has the patient contacted their pharmacy? No (Agent: If no, request that the patient contact the pharmacy for the refill. If patient does not wish to contact the pharmacy document the reason why and proceed with request.) (Agent: If yes, when and what did the pharmacy advise?)  This is the patient's preferred pharmacy:  [facility] - [place], [place] - [address] [place] [address] Phone: [phone] Fax: [phone]   Is this the correct pharmacy for this prescription? Yes If no, delete pharmacy and type the correct one.   Has the prescription been filled recently? Yes  Is the patient out of the medication? Yes  Has the patient been seen for an appointment in the last year OR does the patient have an upcoming appointment? Yes  Can we respond through MyChart? Yes  Agent: Please be advised that Rx refills may take up to 3 business days. We ask that you follow-up with your pharmacy.

## 2024-05-28 ENCOUNTER — Other Ambulatory Visit: Payer: Self-pay | Admitting: Family Medicine

## 2024-05-28 DIAGNOSIS — F988 Other specified behavioral and emotional disorders with onset usually occurring in childhood and adolescence: Secondary | ICD-10-CM

## 2024-05-29 NOTE — Telephone Encounter (Signed)
 Refill request for  Adzenys  XR 12.5 mg LR  04/19/24, #30, 0 rf LOV  02/08/24 FOV  07/16/24  Please review and advise.  Thanks.  Dm/cma

## 2024-07-02 ENCOUNTER — Other Ambulatory Visit: Payer: Self-pay | Admitting: Family Medicine

## 2024-07-02 DIAGNOSIS — F988 Other specified behavioral and emotional disorders with onset usually occurring in childhood and adolescence: Secondary | ICD-10-CM

## 2024-07-16 ENCOUNTER — Telehealth: Payer: Self-pay

## 2024-07-16 ENCOUNTER — Ambulatory Visit: Admitting: Family Medicine

## 2024-07-16 ENCOUNTER — Other Ambulatory Visit (HOSPITAL_COMMUNITY): Payer: Self-pay

## 2024-07-16 VITALS — BP 110/66 | HR 100 | Temp 98.4°F | Ht 65.0 in | Wt 105.4 lb

## 2024-07-16 DIAGNOSIS — F988 Other specified behavioral and emotional disorders with onset usually occurring in childhood and adolescence: Secondary | ICD-10-CM

## 2024-07-16 DIAGNOSIS — Z23 Encounter for immunization: Secondary | ICD-10-CM

## 2024-07-16 MED ORDER — DEXTROAMPHETAMINE SULFATE 5 MG PO TABS: 5.0000 mg | ORAL_TABLET | Freq: Every day | ORAL | 0 refills | Status: DC

## 2024-07-16 MED ORDER — PROPRANOLOL HCL 10 MG PO TABS: 10.0000 mg | ORAL_TABLET | Freq: Every day | ORAL | 1 refills | Status: AC | PRN

## 2024-07-16 MED ORDER — ADZENYS XR-ODT 12.5 MG PO TBED: 1.0000 | EXTENDED_RELEASE_TABLET | Freq: Every day | ORAL | 0 refills | Status: DC

## 2024-07-16 NOTE — Telephone Encounter (Signed)
 I understand that but as of January 1st she is going to need a new one per [employer].    How can we help her with this?

## 2024-07-16 NOTE — Telephone Encounter (Signed)
 Patient will need to have a prior auth done starting January 1st per [facility] for th Adzens XR mg tablet.   Can you please and thank you do this so she doesn't run into a time where she can't get her medication.  Thanks. Dm/cma

## 2024-07-16 NOTE — Progress Notes (Signed)
 [facility] [address] [place] [address] Dept: [phone] Dept Fax: [phone]  Chronic Care Office Visit  Subjective:    Patient ID: Stacey Cunningham, female    DOB: 11-17-2000, 23 y.o..   MRN: [id number]  Chief Complaint  Patient presents with   ADHD    6 month f/u ADHD.   No concerns.    History of Present Illness:  Patient is in today for reassessment of chronic medical conditions.   Stacey Cunningham was diagnosed with ADHD in the 6th grade.  She is a holiday representative at [employer], glass blower/designer in psychologist, educational. Plans to move to graduate school after graduation, with [place]-term goal to provide art therapy. 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 Currently ADHD is managed on Adzenys  XR-ODT 12.5 mg daily. Stacey Cunningham notes that this continues to help focus. There is a concern regarding an upcoming insurance change with a need to obtain a PA to continue on Adzenys . She does have days where the positive effects of Adzenys  wear off prior tot he end of the day. Her brother has a supply of dextroamphetamine  5 mg that he takes later in the day. Additionally, Stacey Cunningham notes there are times when she feels a bit overwhelmed. She asks about propranolol  to calm down anxiety at such times.  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   Chronic depression 07/31/2020   Past Surgical History:  Procedure Laterality Date   MOLE REMOVAL  07/2022   LT bid toe   MOUTH RANULA EXCISION     TYMPANOSTOMY TUBE PLACEMENT     WISDOM TOOTH EXTRACTION  01/2020   Family History  Problem Relation Age of Onset   Hyperlipidemia Father    Hypertension Father    Multiple sclerosis Father    Heart disease Maternal Grandmother    Heart attack Maternal  Grandmother    Heart disease Maternal Grandfather    Cirrhosis Paternal Grandmother    Outpatient Medications Prior to Visit  Medication Sig Dispense Refill   ADZENYS  XR-ODT 12.5 MG TBED TAKE ONE TABLET BY MOUTH EVERY DAY 30 tablet 0   etonogestrel  (NEXPLANON ) 68 MG IMPL implant Inject 1 implant by subcutaneous route.     No facility-administered medications prior to visit.   Allergies[1]   Objective:   Today's Vitals   07/16/24 1053  BP: 110/66  Pulse: 100  Temp: 98.4 F (36.9 C)  TempSrc: Temporal  SpO2: 100%  Weight: 105 lb 6.4 oz (47.8 kg)  Height: 5' 5 (1.651 m)   Body mass index is 17.54 kg/m.   General: Well developed, well nourished. No acute distress. Psych: Alert and oriented. Normal mood and affect.  Health Maintenance Due  Topic Date Due   CHLAMYDIA SCREENING  Never done   HIV Screening  Never done   Hepatitis C Screening  Never done   Meningococcal B Vaccine (2 of 2 - Bexsero SCDM 2-dose series) 04/16/2022   Cervical Cancer Screening (Pap smear)  Never done   Influenza Vaccine  03/02/2024   DTaP/Tdap/Td (8 - Td or Tdap) 03/14/2024   COVID-19 Vaccine (5 - 2025-26 season) 04/02/2024     Assessment & Plan:   Problem List Items Addressed This Visit       Other   Attention deficit  disorder (ADD) without hyperactivity - Primary   Stable. Continue Adzenys  XR-ODT (amphetamine ) 12.5 mg daily.       Relevant Medications   ADZENYS  XR-ODT 12.5 MG TBED   propranolol  (INDERAL ) 10 MG tablet   dextroamphetamine  (DEXTROSTAT ) 5 MG tablet   Other Visit Diagnoses       Need for immunization against influenza       Relevant Orders   Flu vaccine trivalent PF, 6mos and older(Flulaval,Afluria,Fluarix,Fluzone) (Completed)     Need for meningococcal vaccination       Relevant Orders   Meningococcal B, OMV (Completed)       Return in about 5 months (around 12/14/2024).   Garnette CHRISTELLA Simpler, MD  I,Emily Lagle,acting as a scribe for Garnette CHRISTELLA Simpler, MD.,have  documented all relevant documentation on the behalf of Garnette CHRISTELLA Simpler, MD.  I, Garnette CHRISTELLA Simpler, MD, have reviewed all documentation for this visit. The documentation on 07/16/2024 for the exam, diagnosis, procedures, and orders are all accurate and complete.     [1] No Known Allergies

## 2024-07-16 NOTE — Telephone Encounter (Signed)
Thank you. Dm/cma  

## 2024-07-16 NOTE — Assessment & Plan Note (Addendum)
 Continue Adzenys  XR-ODT (amphetamine ) 12.5 mg daily. I will provide a small supply of dextroamphetamine  5 mg for use once a day as needed for times when her other stimulant has warn off before she completes school work for the day. I will also provide her with propranolol  10 mg to use as needed for times when she feels overwhelmed. This may be a bit of anxiety and propanol could help with this.

## 2024-08-15 ENCOUNTER — Telehealth: Payer: Self-pay

## 2024-08-15 NOTE — Telephone Encounter (Signed)
 Copied from CRM [id number]. Topic: Clinical - Medication Prior Auth >> Aug 15, 2024  1:42 PM Aisha D wrote: Reason for CRM: Pt is calling to check the status of the PA request for the ADZENYS  XR-ODT 12.5 MG TBED. Pt stated that she informed the office that starting 08/02/24 she would need to have a PA submitted but hasn't received any updates since. Pt would like a callback with an update.    ----------------------------------------------------------------------- From previous Reason for Contact - Medication Question: Reason for CRM:

## 2024-08-15 NOTE — Telephone Encounter (Signed)
 Message has been sent to PA team to run the PA again. Patient notified VIA phone.  Dm/cma

## 2024-08-17 ENCOUNTER — Other Ambulatory Visit (HOSPITAL_COMMUNITY): Payer: Self-pay

## 2024-08-17 ENCOUNTER — Telehealth: Payer: Self-pay

## 2024-08-17 NOTE — Telephone Encounter (Signed)
Left VM to rtn call. Dm/cma       

## 2024-08-17 NOTE — Telephone Encounter (Signed)
 Pharmacy Patient Advocate Encounter   Received notification from Physician's Office that prior authorization for adzenys  is required/requested.   Insurance verification completed.   The patient is insured through [employer].   Per test claim: The current 30 day co-pay is, $568.51.  No PA needed at this time. This test claim was processed through [facility]- copay amounts may vary at other pharmacies due to pharmacy/plan contracts, or as the patient moves through the different stages of their insurance plan.   PA expires on 03/16/25

## 2024-08-20 ENCOUNTER — Encounter: Payer: Self-pay | Admitting: Family Medicine

## 2024-08-20 DIAGNOSIS — F988 Other specified behavioral and emotional disorders with onset usually occurring in childhood and adolescence: Secondary | ICD-10-CM

## 2024-08-20 NOTE — Telephone Encounter (Signed)
 This encounter was created in error - please disregard.

## 2024-08-20 NOTE — Telephone Encounter (Signed)
 Patient notified VIA phone. Dm/cma

## 2024-08-20 NOTE — Telephone Encounter (Deleted)
 Refill request received for  dextroamphetamine  sulfate 5 mg tablet  FOV: 12/14/24 LOV: 07/16/24 Last refill: 07/16/24 Medication is pending your approval.

## 2024-08-21 NOTE — Telephone Encounter (Signed)
Please review and advise. Thanks. Dm/cma  

## 2024-08-21 NOTE — Telephone Encounter (Signed)
 Copied from CRM #[id number]. Topic: Clinical - Prescription Issue >> Aug 20, 2024  3:43 PM Mesmerise C wrote: Reason for CRM: Patient's mother stated needs a new prescription for ADZENYS  XR-ODT 12.5 MG TBED states it's at a higher cost now with the insurance would like the alternative for dextroamphetamine  ER 12.5mg  would like it sent to [facility] [place], [place] - [address] [place] [address]  [address] [place] [address]

## 2024-08-21 NOTE — Telephone Encounter (Signed)
 Called patient and we will make an appointment for 08/28/24 @11 :00 am VV to discuss meds. Dm/cma

## 2024-08-28 ENCOUNTER — Telehealth: Admitting: Family Medicine

## 2024-08-28 ENCOUNTER — Encounter: Payer: Self-pay | Admitting: Family Medicine

## 2024-08-28 ENCOUNTER — Other Ambulatory Visit (HOSPITAL_COMMUNITY): Payer: Self-pay

## 2024-08-28 VITALS — Ht 65.0 in | Wt 100.0 lb

## 2024-08-28 DIAGNOSIS — F988 Other specified behavioral and emotional disorders with onset usually occurring in childhood and adolescence: Secondary | ICD-10-CM

## 2024-08-28 MED ORDER — AMPHETAMINE ER 15.7 MG PO TBED: 1.0000 | EXTENDED_RELEASE_TABLET | Freq: Every morning | ORAL | 0 refills | Status: AC

## 2024-08-28 NOTE — Assessment & Plan Note (Addendum)
 I will try switching her to the generic form of Adzenys  and increasing her dose. She will be on amphetamine  ER 15.7 mg daily. Continue dextroamphetamine  5 mg for use once a day as needed for times when her other stimulant has warn off before she completes school work for the day, and propranolol  10 mg to use as needed for times when she feels overwhelmed. Plan follow-up at Spring Break.

## 2024-08-28 NOTE — Progress Notes (Signed)
 " [facility] [address] [place] [address] Dept: [phone] Dept Fax: [phone]  Virtual Video Visit  I connected with Stacey Cunningham on 08/28/2024 at 11:00 AM EST by a video enabled telemedicine application and verified that I am speaking with the correct person using two identifiers.  Location patient: Home Location provider: Clinic Persons participating in the virtual visit: Patient, Provider  I discussed the limitations of evaluation and management by telemedicine and the availability of in person appointments. The patient expressed understanding and agreed to proceed.  Chief Complaint  Patient presents with   Attention deficit disorder (ADD) without hyperactivity    Follow up, no concerns   SUBJECTIVE:  HPI: Stacey Cunningham is a 24 y.o. female who presents with for chronic condition management.   Stacey Cunningham has a history of ADHD, diagnosed with ADHD in the 6th grade.  She is a holiday representative at [employer], glass blower/designer in psychologist, educational. Plans to move to graduate school after graduation, with [place]-term goal to provide art therapy. She is also workign two jobs: forensic scientist, and clinical biochemist at [employer]. Currently ADHD is managed on Adzenys  XR-ODT (amphetamine ) 12.5 mg daily. At our last visit, I had also prescribed her dextroamphetamine  (Dextrostat ) 5 mg for use once a day as needed for times when her other stimulant has warn off before she completes school work for the day and propranolol  10 mg to use as needed for times when she feels overwhelmed/anxious. She feels the Dextrostat  and the propranolol  are working well. She has run into an issue with her insurance coverage for the Adzenys . She notes her insurance requires a Prior Authorization. However, she was told by our PA Team that this was not needed. However, when she went to get this filled, it was going to cost her $500+ for a 30-day supply.  Additionally, she notes the Adzenys  has become less  effective with time. She has considered if she might need a higher dose, but admits  reluctance to go up on this.  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   Chronic depression 07/31/2020   Past Surgical History:  Procedure Laterality Date   MOLE REMOVAL  07/2022   LT bid toe   MOUTH RANULA EXCISION     TYMPANOSTOMY TUBE PLACEMENT     WISDOM TOOTH EXTRACTION  01/2020   Family History  Problem Relation Age of Onset   Hyperlipidemia Father    Hypertension Father    Multiple sclerosis Father    Heart disease Maternal Grandmother    Heart attack Maternal Grandmother    Heart disease Maternal Grandfather    Cirrhosis Paternal Grandmother    Social History[1]  Current Outpatient Medications:    ADZENYS  XR-ODT 12.5 MG TBED, Take 1 tablet by mouth daily., Disp: 30 tablet, Rfl: 0   Amphetamine  ER 15.7 MG TBED, Take 1 tablet (15.7 mg total) by mouth every morning., Disp: 30 tablet, Rfl: 0   dextroamphetamine  (DEXTROSTAT ) 5 MG tablet, TAKE ONE TABLET BY MOUTH EVERY DAY AS NEEDED, Disp: 20 tablet, Rfl: 0   etonogestrel  (NEXPLANON ) 68 MG IMPL implant, Inject 1 implant by subcutaneous route., Disp: , Rfl:    propranolol  (INDERAL ) 10 MG tablet, Take 1 tablet (10 mg total) by mouth daily as needed (performance anxiety)., Disp: 30 tablet, Rfl: 1  No Known  Allergies  ROS: See pertinent positives and negatives per HPI.  OBSERVATIONS/OBJECTIVE:  VITALS per patient if applicable: Today's Vitals   08/28/24 1038  Weight: 100 lb (45.4 kg)  Height: 5' 5 (1.651 m)   Body mass index is 16.64 kg/m.   GENERAL: Alert and oriented. Appears well and in no acute distress. PSYCH/NEURO: Pleasant and cooperative. No obvious depression or anxiety. Speech and thought processing grossly intact.  ASSESSMENT AND  PLAN:  Problem List Items Addressed This Visit       Other   Attention deficit disorder (ADD) without hyperactivity - Primary   I will try switching her to the generic form of Adzenys  and increasing her dose. She will be on amphetamine  ER 15.7 mg daily. Continue dextroamphetamine  5 mg for use once a day as needed for times when her other stimulant has warn off before she completes school work for the day, and propranolol  10 mg to use as needed for times when she feels overwhelmed. Plan follow-up at Spring Break.      Relevant Medications   Amphetamine  ER 15.7 MG TBED     I discussed the assessment and treatment plan with the patient. The patient was provided an opportunity to ask questions and all were answered. The patient agreed with the plan and demonstrated an understanding of the instructions.   The patient was advised to call back or seek an in-person evaluation if the symptoms worsen or if the condition fails to improve as anticipated.  Return in about 2 months (around 10/26/2024) for Reassessment.   Stacey CHRISTELLA Simpler, MD  I,Stacey Cunningham,acting as a scribe for Stacey CHRISTELLA Simpler, MD.,have documented all relevant documentation on the behalf of Stacey CHRISTELLA Simpler, MD.  I, Stacey CHRISTELLA Simpler, MD, have reviewed all documentation for this visit. The documentation on 08/28/2024 for the exam, diagnosis, procedures, and orders are all accurate and complete.       [1]  Social History Tobacco Use   Smoking status: Never   Smokeless tobacco: Never  Vaping Use   Vaping status: Never Used  Substance Use Topics   Alcohol use: Yes    Comment: Rare   Drug use: Never   "

## 2024-12-14 ENCOUNTER — Ambulatory Visit: Admitting: Family Medicine
